# Patient Record
Sex: Female | Born: 1975 | Race: White | Hispanic: No | Marital: Married | Smoking: Never smoker
Health system: Southern US, Community
[De-identification: ages and names within clinical notes are randomized; demographics above are authoritative.]

## PROBLEM LIST (undated history)

## (undated) DIAGNOSIS — F32A Depression, unspecified: Secondary | ICD-10-CM

## (undated) DIAGNOSIS — F419 Anxiety disorder, unspecified: Secondary | ICD-10-CM

## (undated) DIAGNOSIS — F329 Major depressive disorder, single episode, unspecified: Secondary | ICD-10-CM

---
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
## 1998-01-20 ENCOUNTER — Encounter: Admission: RE | Admit: 1998-01-20 | Discharge: 1998-01-20 | Payer: Self-pay | Admitting: Family Medicine

## 2003-10-22 ENCOUNTER — Other Ambulatory Visit: Admission: RE | Admit: 2003-10-22 | Discharge: 2003-10-22 | Payer: Self-pay | Admitting: Family Medicine

## 2004-11-17 ENCOUNTER — Ambulatory Visit: Payer: Self-pay | Admitting: Family Medicine

## 2004-12-18 ENCOUNTER — Ambulatory Visit: Payer: Self-pay | Admitting: Family Medicine

## 2005-04-25 ENCOUNTER — Other Ambulatory Visit: Admission: RE | Admit: 2005-04-25 | Discharge: 2005-04-25 | Payer: Self-pay | Admitting: Family Medicine

## 2005-04-25 ENCOUNTER — Ambulatory Visit: Payer: Self-pay | Admitting: Family Medicine

## 2005-05-16 ENCOUNTER — Ambulatory Visit: Payer: Self-pay | Admitting: Family Medicine

## 2005-06-05 ENCOUNTER — Ambulatory Visit: Payer: Self-pay | Admitting: Family Medicine

## 2005-06-20 ENCOUNTER — Ambulatory Visit: Payer: Self-pay | Admitting: Family Medicine

## 2005-06-29 ENCOUNTER — Ambulatory Visit: Payer: Self-pay | Admitting: Family Medicine

## 2005-11-02 ENCOUNTER — Ambulatory Visit: Payer: Self-pay | Admitting: Family Medicine

## 2006-05-20 ENCOUNTER — Ambulatory Visit: Payer: Self-pay | Admitting: Family Medicine

## 2015-10-20 DIAGNOSIS — G43009 Migraine without aura, not intractable, without status migrainosus: Secondary | ICD-10-CM | POA: Insufficient documentation

## 2016-03-29 DIAGNOSIS — F411 Generalized anxiety disorder: Secondary | ICD-10-CM | POA: Insufficient documentation

## 2016-12-14 DIAGNOSIS — G44209 Tension-type headache, unspecified, not intractable: Secondary | ICD-10-CM | POA: Insufficient documentation

## 2017-01-07 ENCOUNTER — Other Ambulatory Visit (HOSPITAL_BASED_OUTPATIENT_CLINIC_OR_DEPARTMENT_OTHER): Payer: Self-pay | Admitting: Physician Assistant

## 2017-01-07 DIAGNOSIS — R102 Pelvic and perineal pain: Secondary | ICD-10-CM

## 2017-01-08 ENCOUNTER — Other Ambulatory Visit (HOSPITAL_BASED_OUTPATIENT_CLINIC_OR_DEPARTMENT_OTHER): Payer: Self-pay | Admitting: Physician Assistant

## 2017-01-08 DIAGNOSIS — R102 Pelvic and perineal pain: Secondary | ICD-10-CM

## 2017-04-22 ENCOUNTER — Other Ambulatory Visit: Payer: Self-pay | Admitting: Physician Assistant

## 2017-04-22 DIAGNOSIS — N644 Mastodynia: Secondary | ICD-10-CM

## 2017-04-25 ENCOUNTER — Other Ambulatory Visit: Payer: Self-pay | Admitting: Physician Assistant

## 2017-04-25 DIAGNOSIS — N644 Mastodynia: Secondary | ICD-10-CM

## 2017-04-30 ENCOUNTER — Ambulatory Visit: Payer: BC Managed Care – PPO

## 2017-04-30 ENCOUNTER — Ambulatory Visit
Admission: RE | Admit: 2017-04-30 | Discharge: 2017-04-30 | Disposition: A | Discharge status: Home / Self Care | Payer: BC Managed Care – PPO | Source: Ambulatory Visit | Attending: Physician Assistant | Admitting: Physician Assistant

## 2017-04-30 DIAGNOSIS — N644 Mastodynia: Secondary | ICD-10-CM

## 2017-11-06 ENCOUNTER — Emergency Department (HOSPITAL_COMMUNITY)
Admission: EM | Admit: 2017-11-06 | Discharge: 2017-11-06 | Disposition: A | Discharge status: Home / Self Care | Payer: BC Managed Care – PPO | Attending: Emergency Medicine | Admitting: Emergency Medicine

## 2017-11-06 ENCOUNTER — Ambulatory Visit: Payer: Self-pay

## 2017-11-06 ENCOUNTER — Encounter (HOSPITAL_COMMUNITY): Payer: Self-pay | Admitting: Emergency Medicine

## 2017-11-06 ENCOUNTER — Emergency Department (HOSPITAL_COMMUNITY): Payer: BC Managed Care – PPO

## 2017-11-06 ENCOUNTER — Other Ambulatory Visit: Payer: Self-pay

## 2017-11-06 DIAGNOSIS — M79671 Pain in right foot: Secondary | ICD-10-CM

## 2017-11-06 DIAGNOSIS — W2209XA Striking against other stationary object, initial encounter: Secondary | ICD-10-CM | POA: Insufficient documentation

## 2017-11-06 DIAGNOSIS — Y999 Unspecified external cause status: Secondary | ICD-10-CM | POA: Insufficient documentation

## 2017-11-06 DIAGNOSIS — Y92009 Unspecified place in unspecified non-institutional (private) residence as the place of occurrence of the external cause: Secondary | ICD-10-CM | POA: Insufficient documentation

## 2017-11-06 DIAGNOSIS — Y9301 Activity, walking, marching and hiking: Secondary | ICD-10-CM | POA: Diagnosis not present

## 2017-11-06 DIAGNOSIS — G8911 Acute pain due to trauma: Secondary | ICD-10-CM | POA: Insufficient documentation

## 2017-11-06 DIAGNOSIS — S91321A Laceration with foreign body, right foot, initial encounter: Secondary | ICD-10-CM | POA: Diagnosis present

## 2017-11-06 MED ORDER — IBUPROFEN 800 MG PO TABS: 800.0000 mg | ORAL_TABLET | Freq: Three times a day (TID) | ORAL | 0 refills | Status: AC

## 2017-11-06 MED ORDER — LIDOCAINE HCL (PF) 1 % IJ SOLN
5.0000 mL | Freq: Once | INTRAMUSCULAR | Status: AC
  Administered 2017-11-06: 5 mL
  Filled 2017-11-06: qty 30

## 2017-11-06 MED ORDER — DOXYCYCLINE HYCLATE 100 MG PO CAPS: 100.0000 mg | ORAL_CAPSULE | Freq: Two times a day (BID) | ORAL | 0 refills | Status: DC

## 2017-11-06 NOTE — ED Triage Notes (Signed)
Patient reports stepping on glass on right foot Saturday and has not been able to remove object. Patient ambulatory.

## 2017-11-06 NOTE — Discharge Instructions (Signed)
Take antibiotics as prescribed. Take the entire course, even if your symptoms improve.  Take ibuprofen 3 times a day with meals.  Do not take other anti-inflammatories at the same time open (Advil, Motrin, naproxen, Aleve). You may supplement with Tylenol if you need further pain control. Use ice packs, 20 minutes at a time, for pain and swelling. Keep the area clean and covered.  Follow up with the foot doctor in 1 week if your symptoms are not improving.  Return to the ER if you develop fevers, numbness, pus draining from the cut, or any new or concerning symptom.

## 2017-11-06 NOTE — ED Notes (Signed)
RT foot dressing applied.

## 2017-11-06 NOTE — Telephone Encounter (Signed)
Pt. called with c/o a sliver of glass in ball of right foot, just below the toes.  Stated she stepped on it on Saturday, and has attempted to remove this on her own, but unsuccessful.  Now reported increased pain, a bump at the site, and the site is wet.  Stated she is driving now, and not far from [facility].  [facility], but [facility].  Ad[facility] to [facility]. Pt. stated she is not far from [facility], and will go there now.

## 2017-11-06 NOTE — ED Provider Notes (Signed)
[facility]-EM[facility]PT Provider Note   CSN: [id number] Arrival date & time: 11/06/17  1813     History   Chief Complaint Chief Complaint  Patient presents with  . Foreign object in foot    HPI Michelle Vega is a 42 y.o. female presenting for evaluation of glass in her foot.  Patient states she was walking in her house on Sunday when she felt something go into her foot.  She took a look, and states she saw a very very small piece of glass in the bottom of her foot.  She tried to pull it out, but instead ended up pushing them further.  Since then, she has had worsening pain at the site where the glass was in.  She reports pain with palpation and when walking.  She has been taking Excedrin for pain without improvement.  She is not on any blood thinners.  She denies numbness or tingling.  Nothing has made the pain better.  She is not immunocompromise, no history of diabetes.  Her last tetanus shot was updated within the past 5 years.  HPI  History reviewed. No pertinent past medical history.  There are no active problems to display for this patient.   History reviewed. No pertinent surgical history.  OB History    No data available       Home Medications    Prior to Admission medications   Medication Sig Start Date End Date Taking? Authorizing Provider  doxycycline (VIBRAMYCIN) 100 MG capsule Take 1 capsule (100 mg total) by mouth 2 (two) times daily. 11/06/17   Terez Montee, PA-C  ibuprofen (ADVIL,MOTRIN) 800 MG tablet Take 1 tablet (800 mg total) by mouth 3 (three) times daily. 11/06/17   Amily Depp, PA-C    Family History No family history on file.  Social History Social History   Tobacco Use  . Smoking status: Not on file  Substance Use Topics  . Alcohol use: Not on file  . Drug use: Not on file     Allergies   Patient has no known allergies.   Review of Systems Review of Systems  Skin: Positive for wound.    Neurological: Negative for numbness.  Hematological: Does not bruise/bleed easily.     Physical Exam Updated Vital Signs BP (!) 134/54 (BP Location: Right Arm)   Pulse 84   Temp 98.7 F (37.1 C) (Oral)   Resp 16   Ht 5\' 9"  (1.753 m)   Wt 65.8 kg (145 lb)   SpO2 100%   BMI 21.41 kg/m   Physical Exam  Constitutional: She is oriented to person, place, and time. She appears well-developed and well-nourished. No distress.  HENT:  Head: Normocephalic and atraumatic.  Eyes: EOM are normal.  Neck: Normal range of motion.  Pulmonary/Chest: Effort normal.  Abdominal: She exhibits no distension.  Musculoskeletal: Normal range of motion.  Neurological: She is alert and oriented to person, place, and time. No sensory deficit.  Skin: Skin is warm. No rash noted.  Patient with tender area on the plantar aspect of the right foot near the ball of her foot.  No erythema, warmth, or drainage.  Pedal pulses intact bilaterally.  Good cap refill.  Full active range of motion of the ankle and toes without difficulty.  Psychiatric: She has a normal mood and affect.  Nursing note and vitals reviewed.    ED Treatments / Results  Labs (all labs ordered are listed, but only abnormal results are displayed)  Labs Reviewed - No data to display  EKG  EKG Interpretation None       Radiology Dg Foot Complete Right  Result Date: 11/06/2017 CLINICAL DATA:  Stepped on broken glass, foreign body assessment. EXAM: RIGHT FOOT COMPLETE - 3+ VIEW COMPARISON:  None. FINDINGS: No acute bony abnormality or observed gas tracking in the soft tissues that the plantar foot. Small os peroneus observed on the oblique projection. No radiographically apparent foreign body, with special attention to the region of concern along the ball of the foot. There is some dorsal spurring at the Lisfranc joint. IMPRESSION: 1. No radiographically apparent foreign body. 2. Dorsal spurring at the Lisfranc joint. Electronically  Signed   By: Gaylyn RongWalter  Liebkemann M.D.   On: 11/06/2017 19:05    Procedures .Foreign Body Removal Date/Time: 11/06/2017 8:15 PM Performed by: Alveria Apleyaccavale, Curtis Uriarte, PA-C Authorized by: Alveria Apleyaccavale, Rockell Faulks, PA-C  Consent: Verbal consent obtained. Risks and benefits: risks, benefits and alternatives were discussed Consent given by: patient Intake: R foot. Anesthesia: local infiltration  Anesthesia: Local Anesthetic: lidocaine 1% without epinephrine Anesthetic total: 8 mL  Sedation: Patient sedated: no  Patient restrained: no Patient cooperative: yes Complexity: simple 0 objects recovered. Objects recovered: none Post-procedure assessment: foreign body not removed Patient tolerance: Patient tolerated the procedure well with no immediate complications Comments: Area numbed and incision made.  No foreign body was encountered.  Irrigated with 100 cc of saline via syringe.  Dressing applied.   (including critical care time)  Medications Ordered in ED Medications  lidocaine (PF) (XYLOCAINE) 1 % injection 5 mL (5 mLs Infiltration Given 11/06/17 1921)     Initial Impression / Assessment and Plan / ED Course  I have reviewed the triage vital signs and the nursing notes.  Pertinent labs & imaging results that were available during my care of the patient were reviewed by me and considered in my medical decision making (see chart for details).     Patient presenting for evaluation of foreign body in her foot.  Physical exam shows patient is neurovascularly intact.  Tetanus is up-to-date.  Will obtain x-ray for further evaluation of the foreign body.  X-ray reviewed and interpreted by me, and shows no foreign body in the foot.  Discussed findings with patient, but patient is adamant that it is still in her foot.  Offered attempt for removal, and patient states she would like to do this.  Offered alternatives including placing patient on antibiotics and follow-up with podiatrist.  Patient  requests to try removal.  Foreign body removal attempted and was unsuccessful.  Wound was irrigated.  Patient placed on antibiotics and anti-inflammatories.  Follow-up with podiatry.  At this time, patient appears safe for discharge.  Return precautions given.  Patient states she understands and agrees to plan.   Final Clinical Impressions(s) / ED Diagnoses   Final diagnoses:  Right foot pain    ED Discharge Orders        Ordered    doxycycline (VIBRAMYCIN) 100 MG capsule  2 times daily     11/06/17 2023    ibuprofen (ADVIL,MOTRIN) 800 MG tablet  3 times daily     11/06/17 2023       Alveria ApleyCaccavale, Deroy Noah, PA-C 11/06/17 2229    Donnetta Hutchingook, Brian, MD 11/08/17 2031

## 2018-07-05 IMAGING — CR DG FOOT COMPLETE 3+V*R*
3 series · 3 of 3 positions shown · non-contrast
Comparison: None.

CLINICAL DATA: Stepped on broken glass, foreign body assessment.

EXAM:
RIGHT FOOT COMPLETE - 3+ VIEW

[x foot ap right]
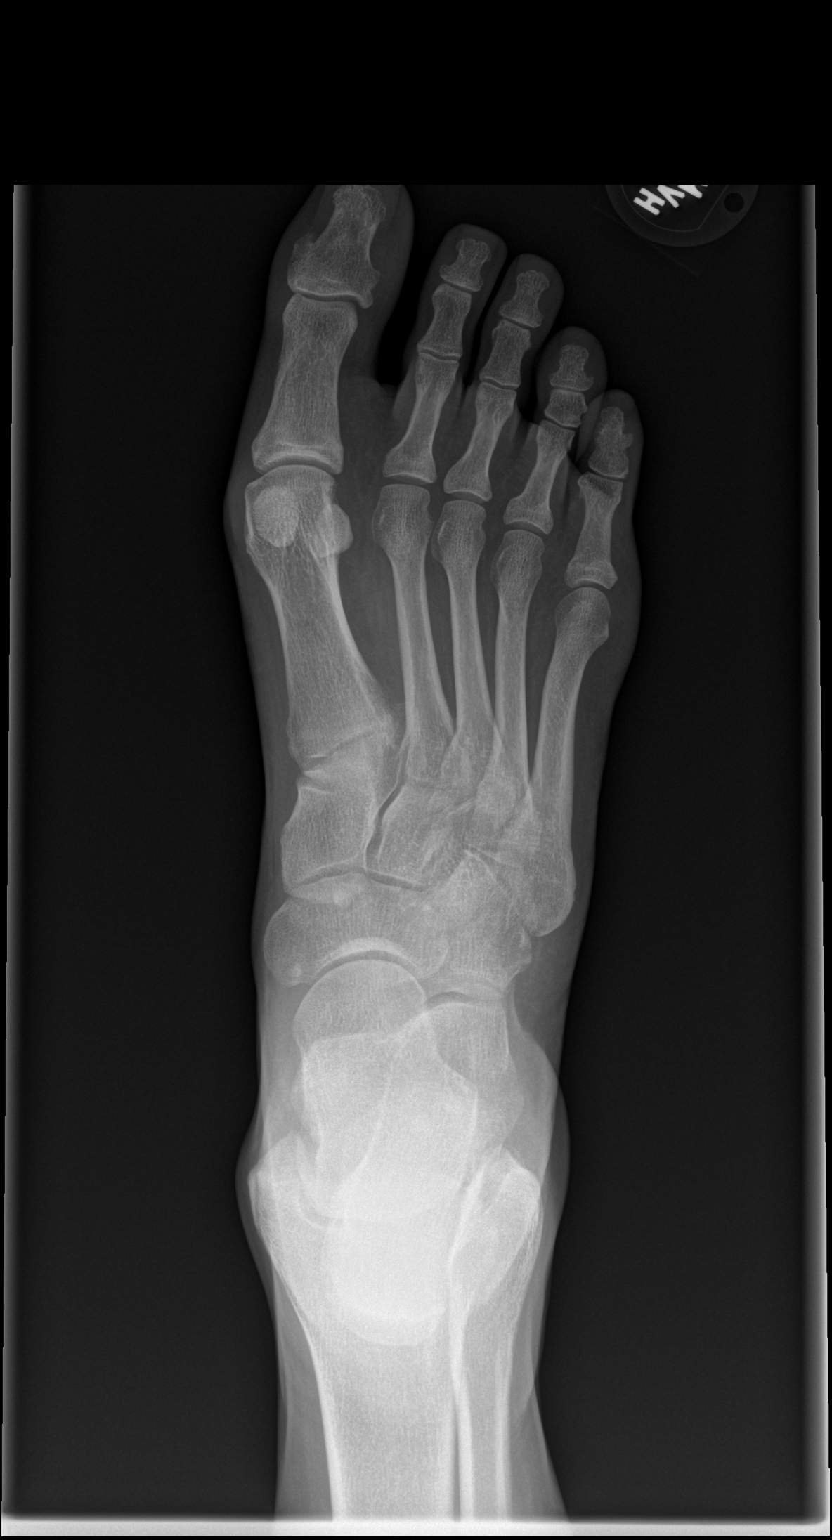

[x foot obl right]
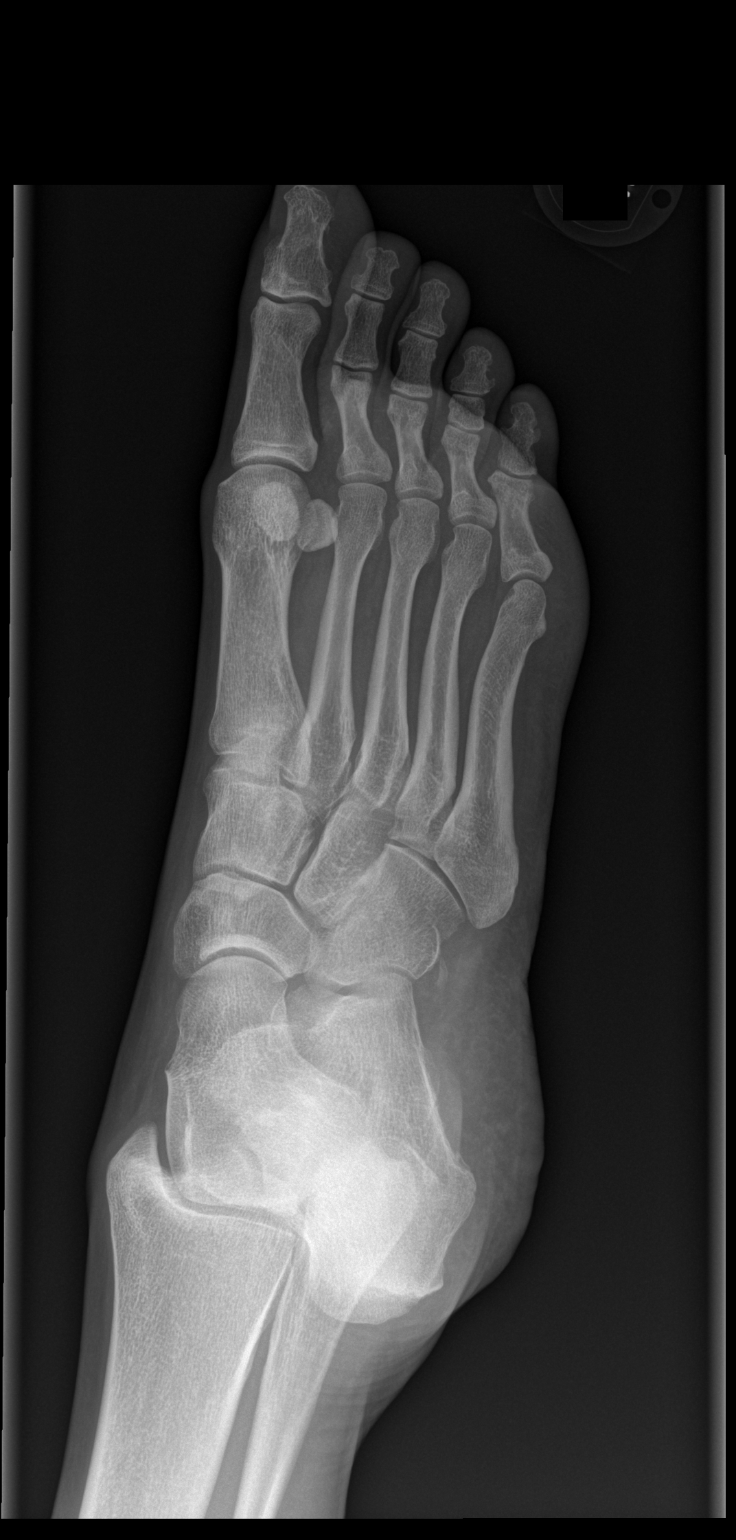

[x foot lat right]
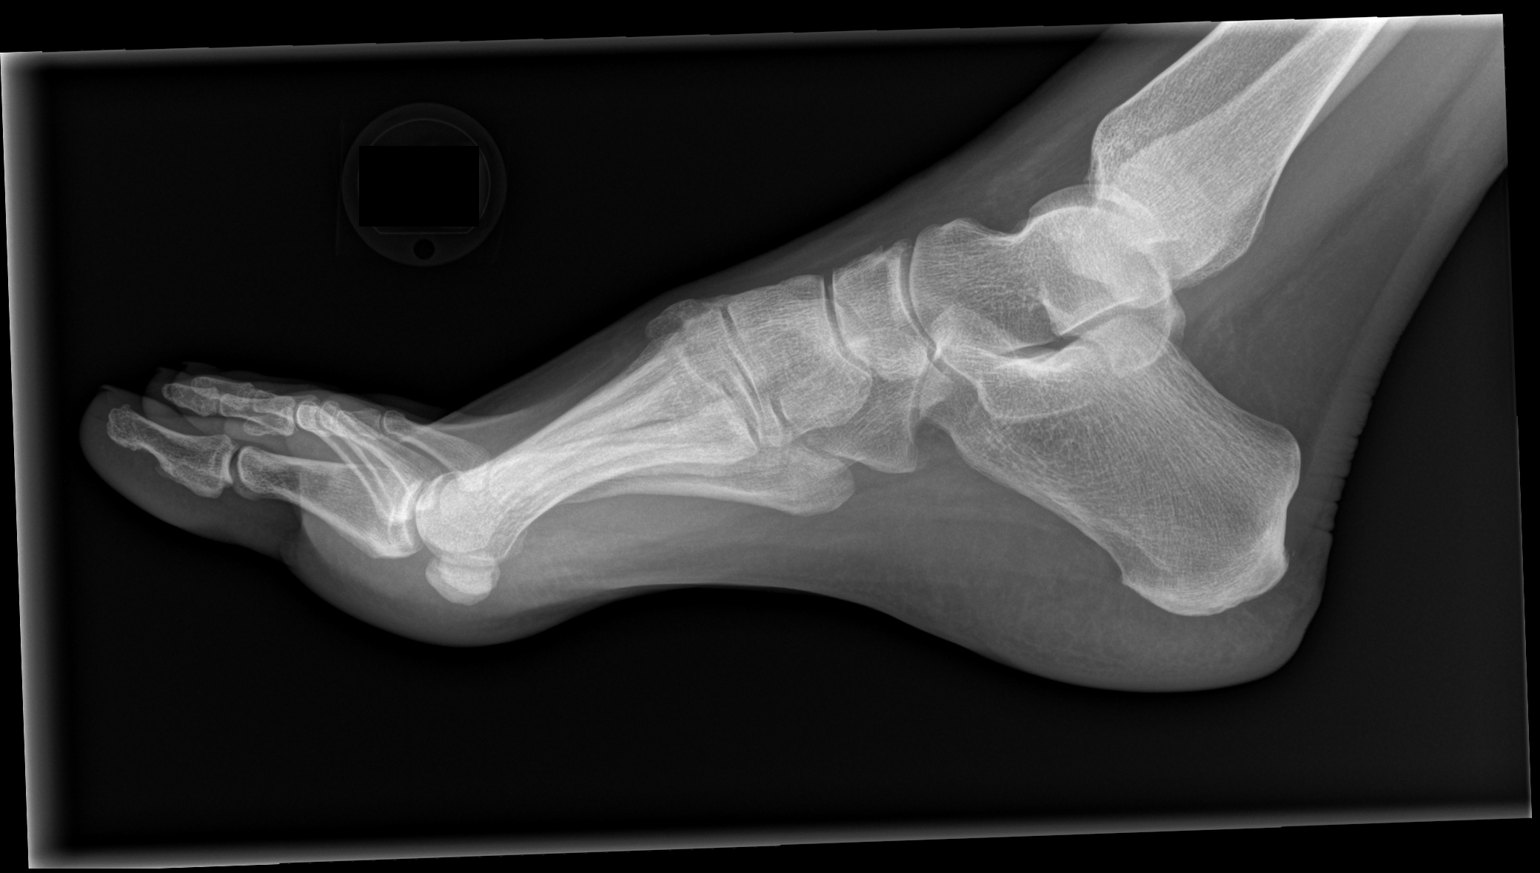

[3 of 3 positions shown; findings below may reference images not displayed]

FINDINGS: No acute bony abnormality or observed gas tracking in the soft
tissues that the plantar foot. Small os peroneus observed on the
oblique projection.

No radiographically apparent foreign body, with special attention to
the region of concern along the ball of the foot.

There is some dorsal spurring at the Lisfranc joint.
IMPRESSION: 1. No radiographically apparent foreign body.
2. Dorsal spurring at the Lisfranc joint.

## 2019-05-03 ENCOUNTER — Other Ambulatory Visit: Payer: Self-pay

## 2019-05-03 DIAGNOSIS — R319 Hematuria, unspecified: Secondary | ICD-10-CM | POA: Insufficient documentation

## 2019-05-03 DIAGNOSIS — N390 Urinary tract infection, site not specified: Secondary | ICD-10-CM | POA: Diagnosis not present

## 2019-05-03 DIAGNOSIS — F419 Anxiety disorder, unspecified: Secondary | ICD-10-CM | POA: Diagnosis not present

## 2019-05-03 DIAGNOSIS — R42 Dizziness and giddiness: Secondary | ICD-10-CM | POA: Insufficient documentation

## 2019-05-03 DIAGNOSIS — R410 Disorientation, unspecified: Secondary | ICD-10-CM | POA: Diagnosis not present

## 2019-05-03 DIAGNOSIS — R200 Anesthesia of skin: Secondary | ICD-10-CM | POA: Diagnosis present

## 2019-05-04 ENCOUNTER — Other Ambulatory Visit: Payer: Self-pay

## 2019-05-04 ENCOUNTER — Emergency Department (HOSPITAL_COMMUNITY)
Admission: EM | Admit: 2019-05-04 | Discharge: 2019-05-04 | Disposition: A | Discharge status: Home / Self Care | Payer: BC Managed Care – PPO | Attending: Emergency Medicine | Admitting: Emergency Medicine

## 2019-05-04 ENCOUNTER — Encounter (HOSPITAL_COMMUNITY): Payer: Self-pay | Admitting: Emergency Medicine

## 2019-05-04 DIAGNOSIS — F419 Anxiety disorder, unspecified: Secondary | ICD-10-CM

## 2019-05-04 DIAGNOSIS — N390 Urinary tract infection, site not specified: Secondary | ICD-10-CM

## 2019-05-04 HISTORY — DX: Depression, unspecified: F32.A

## 2019-05-04 HISTORY — DX: Anxiety disorder, unspecified: F41.9

## 2019-05-04 LAB — CBC WITH DIFFERENTIAL/PLATELET
Abs Immature Granulocytes: 0.02 10*3/uL (ref 0.00–0.07)
Basophils Absolute: 0 10*3/uL (ref 0.0–0.1)
Basophils Relative: 1 %
Eosinophils Absolute: 0.1 10*3/uL (ref 0.0–0.5)
Eosinophils Relative: 2 %
HCT: 42.9 % (ref 36.0–46.0)
Hemoglobin: 15.4 g/dL — ABNORMAL HIGH (ref 12.0–15.0)
Immature Granulocytes: 0 %
Lymphocytes Relative: 43 %
Lymphs Abs: 3.6 10*3/uL (ref 0.7–4.0)
MCH: 31.5 pg (ref 26.0–34.0)
MCHC: 35.9 g/dL (ref 30.0–36.0)
MCV: 87.7 fL (ref 80.0–100.0)
Monocytes Absolute: 0.9 10*3/uL (ref 0.1–1.0)
Monocytes Relative: 11 %
Neutro Abs: 3.5 10*3/uL (ref 1.7–7.7)
Neutrophils Relative %: 43 %
Platelets: 382 10*3/uL (ref 150–400)
RBC: 4.89 MIL/uL (ref 3.87–5.11)
RDW: 11.9 % (ref 11.5–15.5)
WBC: 8.1 10*3/uL (ref 4.0–10.5)
nRBC: 0 % (ref 0.0–0.2)

## 2019-05-04 LAB — COMPREHENSIVE METABOLIC PANEL
ALT: 17 U/L (ref 0–44)
AST: 17 U/L (ref 15–41)
Albumin: 4.2 g/dL (ref 3.5–5.0)
Alkaline Phosphatase: 32 U/L — ABNORMAL LOW (ref 38–126)
Anion gap: 12 (ref 5–15)
BUN: 13 mg/dL (ref 6–20)
CO2: 20 mmol/L — ABNORMAL LOW (ref 22–32)
Calcium: 9.5 mg/dL (ref 8.9–10.3)
Chloride: 108 mmol/L (ref 98–111)
Creatinine, Ser: 0.65 mg/dL (ref 0.44–1.00)
GFR calc Af Amer: >60 mL/min (ref >60)
GFR calc non Af Amer: >60 mL/min (ref >60)
Glucose, Bld: 102 mg/dL — ABNORMAL HIGH (ref 70–99)
Potassium: 3.8 mmol/L (ref 3.5–5.1)
Sodium: 140 mmol/L (ref 135–145)
Total Bilirubin: 1 mg/dL (ref 0.3–1.2)
Total Protein: 7.4 g/dL (ref 6.5–8.1)

## 2019-05-04 LAB — RAPID URINE DRUG SCREEN, HOSP PERFORMED
Amphetamines: POSITIVE — AB
Barbiturates: NOT DETECTED
Benzodiazepines: POSITIVE — AB
Cocaine: NOT DETECTED
Opiates: NOT DETECTED
Tetrahydrocannabinol: NOT DETECTED

## 2019-05-04 LAB — URINALYSIS, ROUTINE W REFLEX MICROSCOPIC
Glucose, UA: NEGATIVE mg/dL
Ketones, ur: 40 mg/dL — AB
Nitrite: POSITIVE — AB
Protein, ur: 30 mg/dL — AB
Specific Gravity, Urine: 1.015 (ref 1.005–1.030)
pH: 7.5 (ref 5.0–8.0)

## 2019-05-04 LAB — URINALYSIS, MICROSCOPIC (REFLEX)

## 2019-05-04 MED ORDER — HYDROXYZINE HCL 25 MG PO TABS: 25.0000 mg | ORAL_TABLET | Freq: Four times a day (QID) | ORAL | 0 refills | Status: AC

## 2019-05-04 MED ORDER — NITROFURANTOIN MONOHYD MACRO 100 MG PO CAPS: 100.0000 mg | ORAL_CAPSULE | Freq: Two times a day (BID) | ORAL | 0 refills | Status: AC

## 2019-05-04 MED ORDER — LORAZEPAM 1 MG PO TABS
1.0000 mg | ORAL_TABLET | Freq: Once | ORAL | Status: AC
  Administered 2019-05-04: 04:00:00 1 mg via ORAL
  Filled 2019-05-04: qty 1

## 2019-05-04 MED ORDER — NITROFURANTOIN MONOHYD MACRO 100 MG PO CAPS
100.0000 mg | ORAL_CAPSULE | Freq: Once | ORAL | Status: DC
  Filled 2019-05-04: qty 1

## 2019-05-04 NOTE — ED Triage Notes (Signed)
Pt reports having anxiety that started today. Pt hyperventilating at time of triage and tearful.

## 2019-05-04 NOTE — ED Provider Notes (Signed)
[facility]   CSN: [id number] Arrival date & time: 05/03/19  2314    History   Chief Complaint Chief Complaint  Patient presents with  . Anxiety    HPI Michelle Vega is a 43 y.o. female.   The history is provided by the patient.  Anxiety  She has history of anxiety and depression and comes in with multiple complaints.  However, what precipitated her coming to the ED was an episode where her hands and feet got numb and her face got numb.  She was lightheaded.  She expresses that she is upset because she thought she had thrush but was found not to have thrush.  She basically states that she has been confused and was having difficulty driving.  She has not had fever or chills and there is been no vomiting or diarrhea.  However, she states she feels that there is something wrong.  She is feeling better since arriving in the emergency department.  Past Medical History:  Diagnosis Date  . Anxiety   . Depression     There are no active problems to display for this patient.   History reviewed. No pertinent surgical history.   OB History   No obstetric history on file.      Home Medications    Prior to Admission medications   Medication Sig Start Date End Date Taking? Authorizing Provider  doxycycline (VIBRAMYCIN) 100 MG capsule Take 1 capsule (100 mg total) by mouth 2 (two) times daily. 11/06/17   Caccavale, Sophia, PA-C  ibuprofen (ADVIL,MOTRIN) 800 MG tablet Take 1 tablet (800 mg total) by mouth 3 (three) times daily. 11/06/17   Caccavale, Sophia, PA-C    Family History History reviewed. No pertinent family history.  Social History Social History   Tobacco Use  . Smoking status: Never Smoker  . Smokeless tobacco: Never Used  Substance Use Topics  . Alcohol use: Never    Frequency: Never  . Drug use: Never     Allergies   Patient has no known allergies.   Review of Systems Review of Systems  All other  systems reviewed and are negative.    Physical Exam Updated Vital Signs BP 102/81 (BP Location: Left Arm)   Pulse 100   Temp 98.2 F (36.8 C) (Oral)   Resp (!) 22   Ht 5\' 9"  (1.753 m)   Wt 65.8 kg   SpO2 99%   BMI 21.41 kg/m   Physical Exam Vitals signs and nursing note reviewed.    43 year old female, resting comfortably and in no acute distress. Vital signs are significant for mildly elevated respiratory rate. Oxygen saturation is 99%, which is normal. Head is normocephalic and atraumatic. PERRLA, EOMI. Oropharynx is clear. Neck is nontender and supple without adenopathy or JVD. Back is nontender and there is no CVA tenderness. Lungs are clear without rales, wheezes, or rhonchi. Chest is nontender. Heart has regular rate and rhythm without murmur. Abdomen is soft, flat, nontender without masses or hepatosplenomegaly and peristalsis is normoactive. Extremities have no cyanosis or edema, full range of motion is present. Skin is warm and dry without rash. Neurologic: Awake and alert but very anxious and with flight of ideas, cranial nerves are intact, there are no motor or sensory deficits.  ED Treatments / Results  Labs (all labs ordered are listed, but only abnormal results are displayed) Labs Reviewed  URINALYSIS, ROUTINE W REFLEX MICROSCOPIC - Abnormal; Notable for the following components:  Result Value   Hgb urine dipstick SMALL (*)    Bilirubin Urine SMALL (*)    Ketones, ur 40 (*)    Protein, ur 30 (*)    Nitrite POSITIVE (*)    Leukocytes,Ua SMALL (*)    All other components within normal limits  RAPID URINE DRUG SCREEN, HOSP PERFORMED - Abnormal; Notable for the following components:   Benzodiazepines POSITIVE (*)    Amphetamines POSITIVE (*)    All other components within normal limits  COMPREHENSIVE METABOLIC PANEL - Abnormal; Notable for the following components:   CO2 20 (*)    Glucose, Bld 102 (*)    Alkaline Phosphatase 32 (*)    All other  components within normal limits  CBC WITH DIFFERENTIAL/PLATELET - Abnormal; Notable for the following components:   Hemoglobin 15.4 (*)    All other components within normal limits  URINALYSIS, MICROSCOPIC (REFLEX) - Abnormal; Notable for the following components:   Bacteria, UA FEW (*)    All other components within normal limits   Procedures Procedures   Medications Ordered in ED Medications - No data to display   Initial Impression / Assessment and Plan / ED Course  I have reviewed the triage vital signs and the nursing notes.  Pertinent lab results that were available during my care of the patient were reviewed by me and considered in my medical decision making (see chart for details).  Anxiety with episode of hyperventilation.  Multiple somatic complaints but nothing obvious on physical exam.  Will check screening labs.  She is given a dose of lorazepam for her anxiety.  Old records are reviewed, and she has no relevant past visits.  CBC and metabolic panel are unremarkable.  Urinalysis shows positive nitrite, and she is started on nitrofurantoin for UTI.  Drug screen is positive for amphetamines and benzodiazepines.  Patient was reevaluated and she is drowsy but still has somewhat pressured speech.  She is reluctant to increase her dose of alprazolam.  She is discharged with prescriptions for nitrofurantoin and hydroxyzine and I have recommended that she reconnect with a psychiatrist to optimize her treatment.  Final Clinical Impressions(s) / ED Diagnoses   Final diagnoses:  Anxiety  Urinary tract infection with hematuria, site unspecified    ED Discharge Orders         Ordered    nitrofurantoin, macrocrystal-monohydrate, (MACROBID) 100 MG capsule  2 times daily     05/04/19 0740    hydrOXYzine (ATARAX/VISTARIL) 25 MG tablet  Every 6 hours     05/04/19 2979           Delora Fuel, MD 89/21/19 [id number]

## 2019-05-04 NOTE — ED Notes (Signed)
PT went to restroom x2 PT state unable to give sample

## 2019-05-04 NOTE — Discharge Instructions (Addendum)
If your anxiety is bad, try increasing your Xanax to 0.5 mg. As an alternative, you can try taking the hydroxyzine to supplement the Xanax.  I think your anxiety is severe enough that you should work with a psychiatrist to optimize your treatment regimen.

## 2019-05-11 ENCOUNTER — Other Ambulatory Visit (HOSPITAL_BASED_OUTPATIENT_CLINIC_OR_DEPARTMENT_OTHER): Payer: Self-pay | Admitting: Physician Assistant

## 2019-05-11 DIAGNOSIS — R410 Disorientation, unspecified: Secondary | ICD-10-CM

## 2019-05-11 DIAGNOSIS — R200 Anesthesia of skin: Secondary | ICD-10-CM

## 2019-05-11 DIAGNOSIS — R4781 Slurred speech: Secondary | ICD-10-CM

## 2019-05-11 DIAGNOSIS — R413 Other amnesia: Secondary | ICD-10-CM

## 2019-05-12 ENCOUNTER — Ambulatory Visit (HOSPITAL_BASED_OUTPATIENT_CLINIC_OR_DEPARTMENT_OTHER): Payer: BC Managed Care – PPO

## 2022-06-30 ENCOUNTER — Ambulatory Visit
Admission: EM | Admit: 2022-06-30 | Discharge: 2022-06-30 | Disposition: A | Discharge status: Home / Self Care | Payer: BC Managed Care – PPO | Attending: Urgent Care | Admitting: Urgent Care

## 2022-06-30 ENCOUNTER — Encounter: Payer: Self-pay | Admitting: Emergency Medicine

## 2022-06-30 DIAGNOSIS — J329 Chronic sinusitis, unspecified: Secondary | ICD-10-CM | POA: Diagnosis not present

## 2022-06-30 DIAGNOSIS — N3000 Acute cystitis without hematuria: Secondary | ICD-10-CM | POA: Diagnosis present

## 2022-06-30 DIAGNOSIS — R3915 Urgency of urination: Secondary | ICD-10-CM | POA: Diagnosis present

## 2022-06-30 LAB — POCT URINALYSIS DIP (MANUAL ENTRY)
Bilirubin, UA: NEGATIVE
Blood, UA: NEGATIVE
Glucose, UA: NEGATIVE mg/dL
Ketones, POC UA: NEGATIVE mg/dL
Nitrite, UA: NEGATIVE
Protein Ur, POC: NEGATIVE mg/dL
Spec Grav, UA: 1.02 (ref 1.010–1.025)
Urobilinogen, UA: 1 E.U./dL
pH, UA: 7.5 (ref 5.0–8.0)

## 2022-06-30 MED ORDER — FLUCONAZOLE 150 MG PO TABS: 150.0000 mg | ORAL_TABLET | Freq: Once | ORAL | 0 refills | Status: AC

## 2022-06-30 MED ORDER — AMOXICILLIN-POT CLAVULANATE 875-125 MG PO TABS: 1.0000 | ORAL_TABLET | Freq: Two times a day (BID) | ORAL | 0 refills | Status: DC

## 2022-06-30 NOTE — ED Triage Notes (Signed)
Pt is present today with c/o urinary incontinences, urgency. Pt sx started x2 months ago.   Pt also states that she is experiencing cough and congestion. Pt sx started one month ago

## 2022-06-30 NOTE — Discharge Instructions (Addendum)
Follow up here or with your primary care provider if your symptoms are worsening or not improving with treatment.     

## 2022-06-30 NOTE — ED Provider Notes (Signed)
[facility]    CSN: [id number] Arrival date & time: 06/30/22  1211      History   Chief Complaint Chief Complaint  Patient presents with   Urinary Frequency    UTI - Entered by patient   Urinary Urgency   Cough    Congestion     HPI Michelle Vega is a 46 y.o. female.    Urinary Frequency  Cough   Presents to UC with complaint of urinary incontinence and urgency x2 months.  Also complaining of cough and congestion x1 month.  She denies any sinus pain or pressure.  Endorses productive cough.  Past Medical History:  Diagnosis Date   Anxiety    Depression     Patient Active Problem List   Diagnosis Date Noted   Tension-type headache, not intractable 12/14/2016   Generalized anxiety disorder 03/29/2016   Migraine without aura and without status migrainosus, not intractable 10/20/2015    History reviewed. No pertinent surgical history.  OB History   No obstetric history on file.      Home Medications    Prior to Admission medications   Medication Sig Start Date End Date Taking? Authorizing Provider  ALPRAZolam Prudy Feeler) 0.5 MG tablet Take by mouth. 12/23/13   [provider]  aspirin-acetaminophen-caffeine (EXCEDRIN MIGRAINE) [id number] MG tablet Take by mouth.    [provider]  DULoxetine (CYMBALTA) 60 MG capsule 60 mg 2 times daily. 05/05/18   [provider]  hydrOXYzine (ATARAX/VISTARIL) 25 MG tablet Take 1 tablet (25 mg total) by mouth every 6 (six) hours. 05/04/19   Dione Booze, MD  ibuprofen (ADVIL,MOTRIN) 800 MG tablet Take 1 tablet (800 mg total) by mouth 3 (three) times daily. 11/06/17   Caccavale, Sophia, PA-C  nitrofurantoin, macrocrystal-monohydrate, (MACROBID) 100 MG capsule Take 1 capsule (100 mg total) by mouth 2 (two) times daily. X 7 days 05/04/19   Dione Booze, MD  pramipexole (MIRAPEX) 1 MG tablet Take by mouth.    [provider]    Family History History reviewed. No pertinent family  history.  Social History Social History   Tobacco Use   Smoking status: Never   Smokeless tobacco: Never  Substance Use Topics   Alcohol use: Never   Drug use: Never     Allergies   Patient has no known allergies.   Review of Systems Review of Systems  Respiratory:  Positive for cough.   Genitourinary:  Positive for frequency.     Physical Exam Triage Vital Signs ED Triage Vitals [06/30/22 1251]  Enc Vitals Group     BP 111/71     Pulse Rate 69     Resp (!) 22     Temp 98.6 F (37 C)     Temp Source Oral     SpO2 99 %     Weight      Height      Head Circumference      Peak Flow      Pain Score      Pain Loc      Pain Edu?      Excl. in GC?    No data found.  Updated Vital Signs BP 111/71 (BP Location: Left Arm)   Pulse 69   Temp 98.6 F (37 C) (Oral)   Resp (!) 22   SpO2 99%   Visual Acuity Right Eye Distance:   Left Eye Distance:   Bilateral Distance:    Right Eye Near:   Left Eye  Near:    Bilateral Near:     Physical Exam Vitals reviewed.  Constitutional:      Appearance: Normal appearance.  Skin:    General: Skin is warm and dry.  Neurological:     General: No focal deficit present.     Mental Status: She is alert and oriented to person, place, and time.  Psychiatric:        Mood and Affect: Mood normal.        Behavior: Behavior normal.      UC Treatments / Results  Labs (all labs ordered are listed, but only abnormal results are displayed) Labs Reviewed - No data to display  EKG   Radiology No results found.  Procedures Procedures (including critical care time)  Medications Ordered in UC Medications - No data to display  Initial Impression / Assessment and Plan / UC Course  I have reviewed the triage vital signs and the nursing notes.  Pertinent labs & imaging results that were available during my care of the patient were reviewed by me and considered in my medical decision making (see chart for details).    Patient is not ill-appearing.  UA is positive for leukocytes and will treat for acute cystitis without hematuria.  Given her complaint of "sinus infection", will attempt to treat potential for both conditions with Augmentin.  Will send urine to lab for confirmatory culture.  Final Clinical Impressions(s) / UC Diagnoses   Final diagnoses:  None   Discharge Instructions   None    ED Prescriptions   None    PDMP not reviewed this encounter.   Rose Phi, [place] 06/30/22 1328

## 2022-07-01 LAB — URINE CULTURE

## 2022-07-15 ENCOUNTER — Ambulatory Visit
Admission: EM | Admit: 2022-07-15 | Discharge: 2022-07-15 | Disposition: A | Discharge status: Home / Self Care | Payer: BC Managed Care – PPO | Attending: Internal Medicine | Admitting: Internal Medicine

## 2022-07-15 DIAGNOSIS — N3001 Acute cystitis with hematuria: Secondary | ICD-10-CM | POA: Diagnosis present

## 2022-07-15 DIAGNOSIS — N898 Other specified noninflammatory disorders of vagina: Secondary | ICD-10-CM | POA: Diagnosis present

## 2022-07-15 DIAGNOSIS — R300 Dysuria: Secondary | ICD-10-CM | POA: Diagnosis present

## 2022-07-15 LAB — POCT URINALYSIS DIP (MANUAL ENTRY)
Glucose, UA: NEGATIVE mg/dL
Nitrite, UA: POSITIVE — AB
Protein Ur, POC: >=300 mg/dL — AB
Spec Grav, UA: 1.025 (ref 1.010–1.025)
Urobilinogen, UA: 2 E.U./dL — AB
pH, UA: 7 (ref 5.0–8.0)

## 2022-07-15 MED ORDER — CEPHALEXIN 500 MG PO CAPS: 500.0000 mg | ORAL_CAPSULE | Freq: Four times a day (QID) | ORAL | 0 refills | Status: AC

## 2022-07-15 NOTE — Discharge Instructions (Signed)
It appears that you may have a persistent urinary tract infection.  I am treating this with an antibiotic.  Your vaginal swab and urine culture pending.  We will call if it is positive and treat as appropriate.  Please follow-up if symptoms persist or worsen.  Also recommend that you follow-up with urologist.

## 2022-07-15 NOTE — ED Provider Notes (Signed)
[facility]    CSN: [id number] Arrival date & time: 07/15/22  1341      History   Chief Complaint Chief Complaint  Patient presents with   Hematuria    HPI Michelle Vega is a 46 y.o. female.   Patient presents with hematuria and a sensation of pain when she urinates that started today at about noon.  Patient also reports white vaginal discharge that started around the same time.  She was seen on 06/30/2022 and treated for urinary tract infection.  She states the symptoms resolved, and these are new symptoms.  She states that she has a history of recurrent urinary tract infections and has been seen by urology multiple years ago and there were no abnormalities found at that time.  Denies any confirmed exposure to STD.  Last menstrual cycle is unknown as she has had an ablation ad does not have menstrual cycles.  Denies urinary frequency, abdominal pain, back pain, fever.   Hematuria    Past Medical History:  Diagnosis Date   Anxiety    Depression     Patient Active Problem List   Diagnosis Date Noted   Tension-type headache, not intractable 12/14/2016   Generalized anxiety disorder 03/29/2016   Migraine without aura and without status migrainosus, not intractable 10/20/2015    History reviewed. No pertinent surgical history.  OB History   No obstetric history on file.      Home Medications    Prior to Admission medications   Medication Sig Start Date End Date Taking? Authorizing Provider  cephALEXin (KEFLEX) 500 MG capsule Take 1 capsule (500 mg total) by mouth 4 (four) times daily. 07/15/22  Yes Ly Bacchi, Michele Rockers, FNP  ALPRAZolam Duanne Moron) 0.5 MG tablet Take by mouth. 12/23/13   [provider]  amoxicillin-clavulanate (AUGMENTIN) 875-125 MG tablet Take 1 tablet by mouth every 12 (twelve) hours. 06/30/22   Immordino, Annie Main, FNP  amphetamine-dextroamphetamine (ADDERALL) 20 MG tablet     [provider]  aspirin-acetaminophen-caffeine  (EXCEDRIN MIGRAINE) [phone] MG tablet Take by mouth.    [provider]  DULoxetine (CYMBALTA) 60 MG capsule 60 mg 2 times daily. 05/05/18   [provider]  hydrOXYzine (ATARAX/VISTARIL) 25 MG tablet Take 1 tablet (25 mg total) by mouth every 6 (six) hours. 4/33/29   Delora Fuel, MD  ibuprofen (ADVIL,MOTRIN) 800 MG tablet Take 1 tablet (800 mg total) by mouth 3 (three) times daily. 11/06/17   Caccavale, Sophia, PA-C  nitrofurantoin, macrocrystal-monohydrate, (MACROBID) 100 MG capsule Take 1 capsule (100 mg total) by mouth 2 (two) times daily. X 7 days 01/26/83   Delora Fuel, MD  pramipexole (MIRAPEX) 1 MG tablet Take by mouth.    [provider]  propranolol (INDERAL) 20 MG tablet TAKE ONE-HALF TO TWO TABLETS BY MOUTH EVERY 6 HOURS AS NEEDED FOR PHYSICAL SYMPTOMS, PERFORMANCE ANXIETY, AND SOCIAL ANXIETY    [provider]    Family History History reviewed. No pertinent family history.  Social History Social History   Tobacco Use   Smoking status: Never   Smokeless tobacco: Never  Substance Use Topics   Alcohol use: Never   Drug use: Never     Allergies   Patient has no known allergies.   Review of Systems Review of Systems Per HPI  Physical Exam Triage Vital Signs ED Triage Vitals  Enc Vitals Group     BP 07/15/22 1503 138/87     Pulse Rate 07/15/22 1503 100     Resp  07/15/22 1503 16     Temp 07/15/22 1503 98 F (36.7 C)     Temp Source 07/15/22 1503 Oral     SpO2 07/15/22 1503 96 %     Weight --      Height --      Head Circumference --      Peak Flow --      Pain Score 07/15/22 1504 10     Pain Loc --      Pain Edu? --      Excl. in GC? --    No data found.  Updated Vital Signs BP 138/87 (BP Location: Left Arm)   Pulse 100   Temp 98 F (36.7 C) (Oral)   Resp 16   SpO2 96%   Visual Acuity Right Eye Distance:   Left Eye Distance:   Bilateral Distance:    Right Eye Near:   Left Eye Near:    Bilateral Near:      Physical Exam Constitutional:      General: She is not in acute distress.    Appearance: Normal appearance. She is not toxic-appearing or diaphoretic.  HENT:     Head: Normocephalic and atraumatic.  Eyes:     Extraocular Movements: Extraocular movements intact.     Conjunctiva/sclera: Conjunctivae normal.  Cardiovascular:     Rate and Rhythm: Normal rate and regular rhythm.     Pulses: Normal pulses.     Heart sounds: Normal heart sounds.  Pulmonary:     Effort: Pulmonary effort is normal. No respiratory distress.     Breath sounds: Normal breath sounds.  Abdominal:     General: Bowel sounds are normal. There is no distension.     Palpations: Abdomen is soft.     Tenderness: There is no abdominal tenderness.  Genitourinary:    Comments: Deferred with shared decision making.  Self swab performed. Neurological:     General: No focal deficit present.     Mental Status: She is alert and oriented to person, place, and time. Mental status is at baseline.  Psychiatric:        Mood and Affect: Mood normal.        Behavior: Behavior normal.        Thought Content: Thought content normal.        Judgment: Judgment normal.      UC Treatments / Results  Labs (all labs ordered are listed, but only abnormal results are displayed) Labs Reviewed  POCT URINALYSIS DIP (MANUAL ENTRY) - Abnormal; Notable for the following components:      Result Value   Color, UA red (*)    Clarity, UA cloudy (*)    Bilirubin, UA small (*)    Ketones, POC UA large (80) (*)    Blood, UA large (*)    Protein Ur, POC >=300 (*)    Urobilinogen, UA 2.0 (*)    Nitrite, UA Positive (*)    Leukocytes, UA Trace (*)    All other components within normal limits  URINE CULTURE  CBC  COMPREHENSIVE METABOLIC PANEL  CERVICOVAGINAL ANCILLARY ONLY    EKG   Radiology No results found.  Procedures Procedures (including critical care time)  Medications Ordered in UC Medications - No data to  display  Initial Impression / Assessment and Plan / UC Course  I have reviewed the triage vital signs and the nursing notes.  Pertinent labs & imaging results that were available during my care of the patient were reviewed  by me and considered in my medical decision making (see chart for details).     Urinalysis positive for trace leukocytes and nitrites.  This is concerning for urinary tract infection.  Patient also has vaginal discharge which is concerning for vaginal yeast infection given recent antibiotic therapy.  Although, patient reports that she took Diflucan recently that she had.  Therefore, will opt today to treat for urinary tract infection with cephalexin antibiotic.  Urine culture and cervicovaginal swab are pending.  Will await results for any further treatment.  Also obtained CMP and CBC given patient's reported severity of symptoms and abnormalities noted on UA.  UA abnormalities other than white blood cells are most likely due to urinary tract infection but will ensure no other worrisome etiologies with blood work.  Patient was advised to go to the ER if symptoms persist or worsen.  Patient originally reported that she was very concerned about her symptoms given that the type of pain that she was having.  Given her concern, patient was advised that it may be best to go to the hospital for further evaluation and management given limited resources here in urgent care.  Patient declined going to the hospital. Risks associated with not going to the hospital were discussed with patient.  Patient voiced understanding and wished to receive treatment here in urgent care.  Patient verbalized understanding and was agreeable with plan. Final Clinical Impressions(s) / UC Diagnoses   Final diagnoses:  Acute cystitis with hematuria  Dysuria  Vaginal discharge     Discharge Instructions      It appears that you may have a persistent urinary tract infection.  I am treating this with an  antibiotic.  Your vaginal swab and urine culture pending.  We will call if it is positive and treat as appropriate.  Please follow-up if symptoms persist or worsen.  Also recommend that you follow-up with urologist.    ED Prescriptions     Medication Sig Dispense Auth. Provider   cephALEXin (KEFLEX) 500 MG capsule Take 1 capsule (500 mg total) by mouth 4 (four) times daily. 28 capsule [place], Acie Fredrickson, [place]      PDMP not reviewed this encounter.   Gustavus Bryant, [place] 07/15/22 [phone]

## 2022-07-15 NOTE — ED Triage Notes (Signed)
Pt c/o hematuria onset ~ noon today.

## 2022-07-17 LAB — CBC
Hematocrit: 45.7 % (ref 34.0–46.6)
Hemoglobin: 14.8 g/dL (ref 11.1–15.9)
MCH: 30.5 pg (ref 26.6–33.0)
MCHC: 32.4 g/dL (ref 31.5–35.7)
MCV: 94 fL (ref 79–97)
Platelets: 374 10*3/uL (ref 150–450)
RBC: 4.85 x10E6/uL (ref 3.77–5.28)
RDW: 12 % (ref 11.7–15.4)
WBC: 14.2 10*3/uL — ABNORMAL HIGH (ref 3.4–10.8)

## 2022-07-17 LAB — COMPREHENSIVE METABOLIC PANEL
ALT: 12 IU/L (ref 0–32)
AST: 16 IU/L (ref 0–40)
Albumin/Globulin Ratio: 1.8 (ref 1.2–2.2)
Albumin: 4.5 g/dL (ref 3.9–4.9)
Alkaline Phosphatase: 40 IU/L — ABNORMAL LOW (ref 44–121)
BUN/Creatinine Ratio: 17 (ref 9–23)
BUN: 11 mg/dL (ref 6–24)
Bilirubin Total: 0.5 mg/dL (ref 0.0–1.2)
CO2: 23 mmol/L (ref 20–29)
Calcium: 9.5 mg/dL (ref 8.7–10.2)
Chloride: 103 mmol/L (ref 96–106)
Creatinine, Ser: 0.65 mg/dL (ref 0.57–1.00)
Globulin, Total: 2.5 g/dL (ref 1.5–4.5)
Glucose: 95 mg/dL (ref 70–99)
Potassium: 4 mmol/L (ref 3.5–5.2)
Sodium: 141 mmol/L (ref 134–144)
Total Protein: 7 g/dL (ref 6.0–8.5)
eGFR: 111 mL/min/{1.73_m2} (ref >59)

## 2022-07-17 LAB — CERVICOVAGINAL ANCILLARY ONLY
Bacterial Vaginitis (gardnerella): NEGATIVE
Candida Glabrata: NEGATIVE
Candida Vaginitis: NEGATIVE
Comment: NEGATIVE
Comment: NEGATIVE
Comment: NEGATIVE

## 2022-07-17 LAB — URINE CULTURE

## 2022-07-18 LAB — URINE CULTURE: Culture: >=100000 — AB

## 2022-07-24 ENCOUNTER — Telehealth (HOSPITAL_COMMUNITY): Payer: Self-pay | Admitting: Emergency Medicine

## 2022-07-24 NOTE — Telephone Encounter (Signed)
Per Rolly Salter, APP call to check on patient.  If not improving or worsening, needs to f/u.  Patient states she got a little better but things seem to be getting worse again.  She is going to make an appointment with Urology.  ED precautions reviewed, she verbalized understanding

## 2023-04-02 ENCOUNTER — Ambulatory Visit: Payer: BC Managed Care – PPO | Admitting: Internal Medicine

## 2023-11-01 DIAGNOSIS — E559 Vitamin D deficiency, unspecified: Secondary | ICD-10-CM | POA: Diagnosis not present

## 2023-11-01 DIAGNOSIS — Z79899 Other long term (current) drug therapy: Secondary | ICD-10-CM | POA: Diagnosis not present

## 2023-11-01 DIAGNOSIS — D539 Nutritional anemia, unspecified: Secondary | ICD-10-CM | POA: Diagnosis not present

## 2023-11-01 DIAGNOSIS — Z6822 Body mass index (BMI) 22.0-22.9, adult: Secondary | ICD-10-CM | POA: Diagnosis not present

## 2023-11-01 DIAGNOSIS — D229 Melanocytic nevi, unspecified: Secondary | ICD-10-CM | POA: Diagnosis not present

## 2023-11-01 DIAGNOSIS — R7302 Impaired glucose tolerance (oral): Secondary | ICD-10-CM | POA: Diagnosis not present

## 2023-11-01 DIAGNOSIS — R5383 Other fatigue: Secondary | ICD-10-CM | POA: Diagnosis not present

## 2023-11-01 DIAGNOSIS — R0602 Shortness of breath: Secondary | ICD-10-CM | POA: Diagnosis not present

## 2023-11-01 DIAGNOSIS — E7800 Pure hypercholesterolemia, unspecified: Secondary | ICD-10-CM | POA: Diagnosis not present

## 2023-11-06 DIAGNOSIS — F339 Major depressive disorder, recurrent, unspecified: Secondary | ICD-10-CM | POA: Diagnosis not present

## 2023-11-06 DIAGNOSIS — F902 Attention-deficit hyperactivity disorder, combined type: Secondary | ICD-10-CM | POA: Diagnosis not present

## 2023-12-13 ENCOUNTER — Emergency Department (HOSPITAL_COMMUNITY)
Admission: EM | Admit: 2023-12-13 | Discharge: 2023-12-14 | Disposition: A | Discharge status: Home / Self Care | Attending: Emergency Medicine | Admitting: Emergency Medicine

## 2023-12-13 ENCOUNTER — Other Ambulatory Visit: Payer: Self-pay

## 2023-12-13 ENCOUNTER — Emergency Department (HOSPITAL_COMMUNITY)

## 2023-12-13 ENCOUNTER — Encounter (HOSPITAL_COMMUNITY): Payer: Self-pay | Admitting: Emergency Medicine

## 2023-12-13 DIAGNOSIS — R0602 Shortness of breath: Secondary | ICD-10-CM | POA: Diagnosis present

## 2023-12-13 DIAGNOSIS — J449 Chronic obstructive pulmonary disease, unspecified: Secondary | ICD-10-CM | POA: Diagnosis not present

## 2023-12-13 DIAGNOSIS — Z7982 Long term (current) use of aspirin: Secondary | ICD-10-CM | POA: Diagnosis not present

## 2023-12-13 LAB — CBC
HCT: 39.8 % (ref 36.0–46.0)
Hemoglobin: 13.6 g/dL (ref 12.0–15.0)
MCH: 30.3 pg (ref 26.0–34.0)
MCHC: 34.2 g/dL (ref 30.0–36.0)
MCV: 88.6 fL (ref 80.0–100.0)
Platelets: 335 10*3/uL (ref 150–400)
RBC: 4.49 MIL/uL (ref 3.87–5.11)
RDW: 12.1 % (ref 11.5–15.5)
WBC: 7.8 10*3/uL (ref 4.0–10.5)
nRBC: 0 % (ref 0.0–0.2)

## 2023-12-13 LAB — BASIC METABOLIC PANEL WITH GFR
Anion gap: 4 — ABNORMAL LOW (ref 5–15)
BUN: 18 mg/dL (ref 6–20)
CO2: 26 mmol/L (ref 22–32)
Calcium: 9.7 mg/dL (ref 8.9–10.3)
Chloride: 108 mmol/L (ref 98–111)
Creatinine, Ser: 0.86 mg/dL (ref 0.44–1.00)
GFR, Estimated: >60 mL/min (ref >60)
Glucose, Bld: 102 mg/dL — ABNORMAL HIGH (ref 70–99)
Potassium: 4 mmol/L (ref 3.5–5.1)
Sodium: 138 mmol/L (ref 135–145)

## 2023-12-13 LAB — TROPONIN I (HIGH SENSITIVITY): Troponin I (High Sensitivity): 3 ng/L (ref <18)

## 2023-12-13 LAB — HCG, SERUM, QUALITATIVE: Preg, Serum: NEGATIVE

## 2023-12-13 NOTE — ED Triage Notes (Signed)
 Pt in with c/o palpitations that began yesterday at work, along with sob and symptoms close to those of an "anxiety attack" which she frequently gets. Pt states she got home and stuck her head in a bath of ice cold water, but the symptoms still remained. Pt states she woke up felling tired, weak and feeling like she cannot catch her breath. Hx of COPD, sats 98%, HR 115, anxious in triage

## 2023-12-14 LAB — TROPONIN I (HIGH SENSITIVITY): Troponin I (High Sensitivity): <2 ng/L (ref <18)

## 2023-12-14 LAB — D-DIMER, QUANTITATIVE: D-Dimer, Quant: 0.47 ug{FEU}/mL (ref 0.00–0.50)

## 2023-12-14 NOTE — Discharge Instructions (Addendum)
 As discussed, your labs and imaging were unremarkable today.  There are no emergent causes identified for your shortness of breath and palpitations.  Follow-up with your primary care provider in the next 3 to 5 days for reevaluation.  Get help right away if: Your shortness of breath gets worse. You have trouble breathing when you are resting. You feel light-headed or you faint. You have a cough that is not helped by medicines. You cough up blood. You have pain with breathing. You have pain in your chest, arms, shoulders, or belly (abdomen). You have a fever.

## 2023-12-14 NOTE — ED Provider Notes (Signed)
 [place] EM[facility]PARTMENT AT [facility] Provider Note   CSN: [id number] Arrival date & time: 12/13/23  2106     History  Chief Complaint  Patient presents with   Shortness of Breath    Michelle Vega is a 48 y.o. female with history of generalized anxiety disorder presents the ED today for shortness of breath.  Patient reports an episode of palpitations that began yesterday at work with associated shortness of breath as well as tingling of her hands and face.  She tried sticking her face in ice cold water, as this has helped with anxiety attacks in the past but it did not help this time.  Symptoms ultimately resolved on their own.  Patient reports that the same thing happened again today when she was sitting on the couch prior to arrival.  Denies any symptoms at the time of evaluation.  Denies any associated chest pains or fevers.  Her brother at bedside states that her primary care provider said she could have possible COPD.  Denies history of asthma or smoking in the past.  Does endorse family history of blood clots.  No additional complaints or concerns at this time.    Home Medications Prior to Admission medications   Medication Sig Start Date End Date Taking? Authorizing Provider  ALPRAZolam Prudy Feeler) 0.5 MG tablet Take by mouth. 12/23/13   [provider]  amoxicillin-clavulanate (AUGMENTIN) 875-125 MG tablet Take 1 tablet by mouth every 12 (twelve) hours. 06/30/22   Immordino, Jeannett Senior, FNP  amphetamine-dextroamphetamine (ADDERALL) 20 MG tablet     [provider]  aspirin-acetaminophen-caffeine (EXCEDRIN MIGRAINE) [phone] MG tablet Take by mouth.    [provider]  cephALEXin (KEFLEX) 500 MG capsule Take 1 capsule (500 mg total) by mouth 4 (four) times daily. 07/15/22   Gustavus Bryant, FNP  DULoxetine (CYMBALTA) 60 MG capsule 60 mg 2 times daily. 05/05/18   [provider]  hydrOXYzine (ATARAX/VISTARIL) 25 MG tablet Take 1  tablet (25 mg total) by mouth every 6 (six) hours. 05/04/19   Dione Booze, MD  ibuprofen (ADVIL,MOTRIN) 800 MG tablet Take 1 tablet (800 mg total) by mouth 3 (three) times daily. 11/06/17   Caccavale, Sophia, PA-C  nitrofurantoin, macrocrystal-monohydrate, (MACROBID) 100 MG capsule Take 1 capsule (100 mg total) by mouth 2 (two) times daily. X 7 days 05/04/19   Dione Booze, MD  pramipexole (MIRAPEX) 1 MG tablet Take by mouth.    [provider]  propranolol (INDERAL) 20 MG tablet TAKE ONE-HALF TO TWO TABLETS BY MOUTH EVERY 6 HOURS AS NEEDED FOR PHYSICAL SYMPTOMS, PERFORMANCE ANXIETY, AND SOCIAL ANXIETY    [provider]      Allergies    Patient has no known allergies.    Review of Systems   Review of Systems  Respiratory:  Positive for shortness of breath.   All other systems reviewed and are negative.   Physical Exam Updated Vital Signs BP 112/84 (BP Location: Right Arm)   Pulse 79   Temp 97.8 F (36.6 C) (Oral)   Resp (!) 24   Ht 5\' 9"  (1.753 m)   Wt 68 kg   SpO2 100%   BMI 22.15 kg/m  Physical Exam Vitals and nursing note reviewed.  Constitutional:      General: She is not in acute distress.    Appearance: Normal appearance.  HENT:     Head: Normocephalic and atraumatic.     Mouth/Throat:     Mouth: Mucous membranes are moist.  Eyes:     Conjunctiva/sclera: Conjunctivae normal.     Pupils: Pupils are equal, round, and reactive to light.  Cardiovascular:     Rate and Rhythm: Normal rate and regular rhythm.     Pulses: Normal pulses.     Heart sounds: Normal heart sounds.  Pulmonary:     Effort: Pulmonary effort is normal.     Breath sounds: Normal breath sounds.  Abdominal:     Palpations: Abdomen is soft.     Tenderness: There is no abdominal tenderness.  Musculoskeletal:        General: Normal range of motion.     Cervical back: Normal range of motion.  Skin:    General: Skin is warm and dry.     Findings: No rash.  Neurological:      General: No focal deficit present.     Mental Status: She is alert.  Psychiatric:        Mood and Affect: Mood normal.        Behavior: Behavior normal.    ED Results / Procedures / Treatments   Labs (all labs ordered are listed, but only abnormal results are displayed) Labs Reviewed  BASIC METABOLIC PANEL WITH GFR - Abnormal; Notable for the following components:      Result Value   Glucose, Bld 102 (*)    Anion gap 4 (*)    All other components within normal limits  CBC  HCG, SERUM, QUALITATIVE  D-DIMER, QUANTITATIVE  TROPONIN I (HIGH SENSITIVITY)  TROPONIN I (HIGH SENSITIVITY)    EKG EKG Interpretation Date/Time:  Friday December 13 2023 22:01:22 EDT Ventricular Rate:  116 PR Interval:  122 QRS Duration:  78 QT Interval:  324 QTC Calculation: 450 R Axis:   79  Text Interpretation: Sinus tachycardia Right atrial enlargement Nonspecific ST abnormality Abnormal ECG No previous ECGs available Confirmed by Kennis Carina [phone]) on 12/14/2023 2:53:14 AM  Radiology DG Chest 2 View Result Date: 12/13/2023 CLINICAL DATA:  Shortness of breath EXAM: CHEST - 2 VIEW COMPARISON:  None Available. FINDINGS: The heart size and mediastinal contours are within normal limits. Both lungs are clear. The visualized skeletal structures are unremarkable. IMPRESSION: No active cardiopulmonary disease. Electronically Signed   By: Alcide Clever M.D.   On: 12/13/2023 22:25    Procedures Procedures: not indicated.   Medications Ordered in ED Medications - No data to display  ED Course/ Medical Decision Making/ A&P                                 Medical Decision Making Amount and/or Complexity of Data Reviewed Labs: ordered. Radiology: ordered.   This patient presents to the ED for concern of shortness of breath, this involves an extensive number of treatment options, and is a complaint that carries with it a high risk of complications and morbidity.   Differential diagnosis includes:  Asthma/COPD exacerbation, acute anxiety attack, pulmonary embolism, ACS, etc.   Comorbidities  See HPI above   Additional History  Additional history obtained from prior records   Cardiac Monitoring / EKG  The patient was maintained on a cardiac monitor.  I personally viewed and interpreted the cardiac monitored which showed: Sinus rhythm with a heart rate of 76 bpm.   Lab Tests  I ordered and personally interpreted labs.  The pertinent results include:   BMP and CBC are reassuring Negative pregnancy test Negative initial and delta troponin Negative  D-dimer   Imaging Studies  I ordered imaging studies including CXR  I independently visualized and interpreted imaging which showed:  No acute cardiopulmonary disease I agree with the radiologist interpretation   Problem List / ED Course / Critical Interventions / Medication Management  Patient reports 2 episodes of shortness of breath with palpitations and tingling sensation in the hands and face.  First episode was yesterday the second episode happened today prior to arrival.  Denies any shortness of breath at this time.  No chest pain associated with these events. Reports history of anxiety but states that this does not feel the same as her typical anxiety attacks. Discussed findings with patient and brother at bedside.  All questions were answered. I have reviewed the patients home medicines and have made adjustments as needed   Social Determinants of Health  Access to healthcare   Test / Admission - Considered  Patient is stable and safe for discharge home. Return precautions given.       Final Clinical Impression(s) / ED Diagnoses Final diagnoses:  Shortness of breath    Rx / DC Orders ED Discharge Orders     None         Maxwell Marion, PA-C 12/14/23 0447    Sabas Sous, MD 12/14/23 [phone]

## 2024-07-28 ENCOUNTER — Ambulatory Visit
Admission: RE | Admit: 2024-07-28 | Discharge: 2024-07-28 | Disposition: A | Discharge status: Home / Self Care | Source: Ambulatory Visit

## 2024-07-28 VITALS — BP 121/74 | HR 97 | Temp 98.0°F

## 2024-07-28 DIAGNOSIS — S61412A Laceration without foreign body of left hand, initial encounter: Secondary | ICD-10-CM | POA: Diagnosis not present

## 2024-07-28 NOTE — ED Provider Notes (Signed)
 [facility]    CSN: [id number] Arrival date & time: 07/28/24  0836      History   Chief Complaint Chief Complaint  Patient presents with   Laceration    HPI Michelle Vega is a 48 y.o. female presenting with laceration to the palm of the left hand sustained 3 days ago.  States she fell and cut her hand on glass.  She has not washed the wound, but has applied Neosporin throughout the day.  She is not immunocompromised.  Tdap up-to-date 2 years ago.  HPI  Past Medical History:  Diagnosis Date   Anxiety    Depression     Patient Active Problem List   Diagnosis Date Noted   Tension-type headache, not intractable 12/14/2016   Generalized anxiety disorder 03/29/2016   Migraine without aura and without status migrainosus, not intractable 10/20/2015    History reviewed. No pertinent surgical history.  OB History   No obstetric history on file.      Home Medications    Prior to Admission medications   Medication Sig Start Date End Date Taking? Authorizing Provider  ALPRAZolam (XANAX) 0.5 MG tablet Take by mouth. 12/23/13   [provider]  amphetamine-dextroamphetamine (ADDERALL) 20 MG tablet     [provider]  aspirin-acetaminophen-caffeine (EXCEDRIN MIGRAINE) 250-250-65 MG tablet Take by mouth.    [provider]  cephALEXin  (KEFLEX ) 500 MG capsule Take 1 capsule (500 mg total) by mouth 4 (four) times daily. 07/15/22   Hazen Darryle BRAVO, FNP  DULoxetine (CYMBALTA) 60 MG capsule 60 mg 2 times daily. 05/05/18   [provider]  hydrOXYzine  (ATARAX /VISTARIL ) 25 MG tablet Take 1 tablet (25 mg total) by mouth every 6 (six) hours. 05/04/19   Raford Lenis, MD  ibuprofen  (ADVIL ,MOTRIN ) 800 MG tablet Take 1 tablet (800 mg total) by mouth 3 (three) times daily. 11/06/17   Caccavale, Sophia, PA-C  nitrofurantoin , macrocrystal-monohydrate, (MACROBID ) 100 MG capsule Take 1 capsule (100 mg total) by mouth 2 (two) times daily. X 7 days  05/04/19   Raford Lenis, MD  pramipexole (MIRAPEX) 1 MG tablet Take by mouth.    [provider]  propranolol (INDERAL) 20 MG tablet TAKE ONE-HALF TO TWO TABLETS BY MOUTH EVERY 6 HOURS AS NEEDED FOR PHYSICAL SYMPTOMS, PERFORMANCE ANXIETY, AND SOCIAL ANXIETY    [provider]    Family History History reviewed. No pertinent family history.  Social History Social History   Tobacco Use   Smoking status: Never   Smokeless tobacco: Never  Substance Use Topics   Alcohol use: Never   Drug use: Never     Allergies   Patient has no known allergies.   Review of Systems Review of Systems  Skin:  Positive for wound.     Physical Exam Triage Vital Signs ED Triage Vitals  Encounter Vitals Group     BP      Girls Systolic BP Percentile      Girls Diastolic BP Percentile      Boys Systolic BP Percentile      Boys Diastolic BP Percentile      Pulse      Resp      Temp      Temp src      SpO2      Weight      Height      Head Circumference      Peak Flow      Pain Score      Pain  Loc      Pain Education      Exclude from Growth Chart    No data found.  Updated Vital Signs BP 121/74 (BP Location: Right Arm)   Pulse 97   Temp 98 F (36.7 C) (Oral)   SpO2 100%   Visual Acuity Right Eye Distance:   Left Eye Distance:   Bilateral Distance:    Right Eye Near:   Left Eye Near:    Bilateral Near:     Physical Exam Vitals reviewed.  Constitutional:      General: She is not in acute distress.    Appearance: Normal appearance. She is not ill-appearing.  HENT:     Head: Normocephalic and atraumatic.  Pulmonary:     Effort: Pulmonary effort is normal.  Skin:    Comments: See image below Palmar aspect of left hand with 2.5 cm laceration overlying the 4th and 5th MCP joints.  There is no active bleeding.  Sensation intact.  There is no surrounding erythema, warmth, induration, or fluctuance.  Wound appears to be healing well.  Neurological:      General: No focal deficit present.     Mental Status: She is alert and oriented to person, place, and time.  Psychiatric:        Mood and Affect: Mood normal.        Behavior: Behavior normal.        Thought Content: Thought content normal.        Judgment: Judgment normal.      L hand   UC Treatments / Results  Labs (all labs ordered are listed, but only abnormal results are displayed) Labs Reviewed - No data to display  EKG   Radiology No results found.  Procedures Procedures (including critical care time)  Medications Ordered in UC Medications - No data to display  Initial Impression / Assessment and Plan / UC Course  I have reviewed the triage vital signs and the nursing notes.  Pertinent labs & imaging results that were available during my care of the patient were reviewed by me and considered in my medical decision making (see chart for details).     Patient is a pleasant 48 year old female presenting with laceration to the palm of the left hand, sustained 3 days ago.  The wound is healing well.  She is afebrile and nontachycardic, and the wound is neurovascularly intact.  Wound care discussed as below.  She is not immunocompromised.  Tdap UTD 2 years ago. S/p uterine ablation.  Final Clinical Impressions(s) / UC Diagnoses   Final diagnoses:  Laceration of left hand, foreign body presence unspecified, initial encounter     Discharge Instructions      -Wash your wound with gentle soap and water 2 times daily.  Let air dry or gently pat. You can follow with over-the-counter neosporin ointment (or similar). Keep wrapped when you're doing something that could get it dirty (working, presenter, broadcasting, cooking, catering manager). Avoid cleansing with hydrogen peroxide or alcohol!! -Seek additional medical attention if the wound is getting worse instead of better- redness increasing in size, pain getting worse, new/worsening discharge, new fevers/chills, etc.      ED Prescriptions    None    PDMP not reviewed this encounter.   Arlyss Leita BRAVO, PA-C 07/28/24 [phone]

## 2024-07-28 NOTE — ED Triage Notes (Addendum)
 Pt presents due to cut on left palm. She fell three days ago and cut hand on glass.    Last Tdap was 2 years ago.

## 2024-07-28 NOTE — Discharge Instructions (Signed)
-  Wash your wound with gentle soap and water 2 times daily.  Let air dry or gently pat. You can follow with over-the-counter neosporin ointment (or similar). Keep wrapped when you're doing something that could get it dirty (working, presenter, broadcasting, cooking, catering manager). Avoid cleansing with hydrogen peroxide or alcohol!! -Seek additional medical attention if the wound is getting worse instead of better- redness increasing in size, pain getting worse, new/worsening discharge, new fevers/chills, etc.
